# Patient Record
Sex: Female | Born: 1980 | Race: White | Hispanic: No | Marital: Married | Smoking: Never smoker
Health system: Southern US, Community
[De-identification: ages and names within clinical notes are randomized; demographics above are authoritative.]

---

## 2003-01-15 ENCOUNTER — Other Ambulatory Visit: Admission: RE | Admit: 2003-01-15 | Discharge: 2003-01-15 | Payer: Self-pay | Admitting: Gynecology

## 2003-07-12 ENCOUNTER — Ambulatory Visit (HOSPITAL_COMMUNITY): Admission: RE | Admit: 2003-07-12 | Discharge: 2003-07-12 | Payer: Self-pay

## 2003-07-12 ENCOUNTER — Encounter (INDEPENDENT_AMBULATORY_CARE_PROVIDER_SITE_OTHER): Payer: Self-pay | Admitting: Specialist

## 2003-07-12 ENCOUNTER — Ambulatory Visit (HOSPITAL_BASED_OUTPATIENT_CLINIC_OR_DEPARTMENT_OTHER): Admission: RE | Admit: 2003-07-12 | Discharge: 2003-07-12 | Payer: Self-pay

## 2004-03-26 ENCOUNTER — Other Ambulatory Visit: Admission: RE | Admit: 2004-03-26 | Discharge: 2004-03-26 | Payer: Self-pay | Admitting: Gynecology

## 2005-09-24 ENCOUNTER — Other Ambulatory Visit: Admission: RE | Admit: 2005-09-24 | Discharge: 2005-09-24 | Payer: Self-pay | Admitting: Gynecology

## 2008-09-21 ENCOUNTER — Encounter: Admission: RE | Admit: 2008-09-21 | Discharge: 2008-09-21 | Payer: Self-pay | Admitting: Orthopedic Surgery

## 2008-09-25 ENCOUNTER — Encounter: Admission: RE | Admit: 2008-09-25 | Discharge: 2008-09-25 | Payer: Self-pay | Admitting: Orthopedic Surgery

## 2009-12-10 ENCOUNTER — Encounter: Admission: RE | Admit: 2009-12-10 | Discharge: 2009-12-10 | Payer: Self-pay | Admitting: Diagnostic Neuroimaging

## 2010-06-10 ENCOUNTER — Ambulatory Visit (HOSPITAL_COMMUNITY): Admission: RE | Admit: 2010-06-10 | Discharge: 2010-06-10 | Payer: Self-pay | Admitting: Obstetrics and Gynecology

## 2011-01-15 NOTE — H&P (Signed)
NAME:  Brandy Silva, Brandy Silva                          ACCOUNT NO.:  [id number]   MEDICAL RECORD NO.:  [id number]                   PATIENT TYPE:  AMB   LOCATION:  [facility]                                 FACILITY:  [facility]   PHYSICIAN:  Ivor Costa. Farrel Gobble, M.D.              DATE OF BIRTH:  Aug 23, 1981   DATE OF ADMISSION:  07/12/2003  DATE OF DISCHARGE:                                HISTORY & PHYSICAL   CHIEF COMPLAINT:  Pelvic pain and primary dysmenorrhea.   HISTORY OF PRESENT ILLNESS:  The patient is a 30 year old gravida 0 with a  history of primary dysmenorrhea.  The patient states that her menses began  around age 17.  They were severe enough that she would often miss school and  would have nausea and vomiting.  She no longer has the nausea and vomiting,  however, but states that the pain is still there.  It has become  increasingly worse.  She has been on birth control pills since the age of 67  in order to regulate her period and help control the pain.  She states that  the pain got pretty bad last year, requiring her to treat it with Vicodin.  It got better but then has come back again this year, again requiring  Vicodin.  She is not happy taking Vicodin because of the way it makes her  feel.  She, fortunately, has no dyspareunia and no dysfunctional bleeding.   PAST OBSTETRICAL/GYNECOLOGIC HISTORY:  Otherwise unremarkable.  Her Pap  smears are normal.  Her cycles are regular.   PAST MEDICAL HISTORY:  Negative.   PAST SURGICAL HISTORY:  Negative.   SOCIAL HISTORY:  She is married.  No alcohol or tobacco.  Occasional  caffeine.  Informal exercise.   ALLERGIES:  Seasonal.   MEDICATIONS:  Allegra D and Nasonex.   FAMILY HISTORY:  Negative for endometriosis, infertility.  There is a family  history of breast cancer in a maternal grandmother.  No other GYN cancers.   PHYSICAL EXAMINATION:  GENERAL:  She is well-appearing.  CARDIAC:  Her heart is regular rate.  CHEST:  Her lungs are  clear to auscultation.  ABDOMEN:  Without rebound or guarding.  PELVIC:  She has normal external female genitalia.  The BUS is negative.  The vagina was pink and moist.  The cervix is notable for a prominent  ectropion, otherwise without gross lesions.  There was no cervical motion  tenderness.  The uterus is anteverted, mobile, and nontender.  The adnexa  are without tenderness or fullness.  There is some slight tenderness on the  left.  However, on rectovaginal exam there was tenderness bilaterally over  the uterosacral ligaments, on the right greater than the left.   GYN ultrasound:  The uterus is anteverted, normal size, shape, and contour.  The right ovary has an echo-free, thin-walled cyst inferior that measures  2.5 x 2 x  2, which had been noted on a previous ultrasound since June  without any change and without any vascular flow.  The left adnexa is  negative.   ASSESSMENT:  1. Primary dysmenorrhea.  2. Probable paratubal cyst with pelvic pain.   The patient will present for a laparoscopy, cystectomy, possible fulguration  of endometriosis.  Risks and benefits of the procedure were discussed with  the patient and accepted.  She will present on the morning of November 12.  Of note, she was given Lortab preoperatively for her postoperative pain  management.                                               Ivor Costa. Farrel Gobble, M.D.    THL/MEDQ  D:  07/11/2003  T:  07/11/2003  Job:  [id number]

## 2011-01-15 NOTE — Op Note (Signed)
NAME:  DANYLE, BOENING                          ACCOUNT NO.:  [id number]   MEDICAL RECORD NO.:  [id number]                   PATIENT TYPE:  AMB   LOCATION:  [facility]                                 FACILITY:  [facility]   PHYSICIAN:  Ivor Costa. Farrel Gobble, M.D.              DATE OF BIRTH:  [birth year]   DATE OF PROCEDURE:  07/12/2003  DATE OF DISCHARGE:                                 OPERATIVE REPORT   PREOPERATIVE DIAGNOSES:  1. Primary dysmenorrhea.  2. Paratubal cyst.   POSTOPERATIVE DIAGNOSES:  1. Primary dysmenorrhea.  2. Paratubal cyst.  3. Ovarian cyst.  4. Endometriosis.   OPERATION/PROCEDURE:  1. Laparoscopic fulguration of endometriosis with the Yag laser.  2. Left ovarian cystectomy.  3. Right paratubal marsupialization.   SURGEON:  Ivor Costa. Farrel Gobble, M.D.   ASSISTANT:  Rande Brunt. Eda Paschal, M.D.   ANESTHESIA:  General.   ESTIMATED BLOOD LOSS:  Minimal.   FINDINGS:  There were endometriotic peritoneal studding in the cul-de-sac,  mostly uterosacral ligaments bilaterally.  There was some anterior  peritoneal endometriosis as well as uterine serosal endometriosis.  There  was a left paratubal cyst that was intimate to the fimbria on the right  which were otherwise unremarkable and lush.  There was a left ovarian cyst.  The appendix, gallbladder and liver were unremarkable.   PATHOLOGY:  Left ovarian cyst wall.   COMPLICATIONS:  None.   DESCRIPTION OF PROCEDURE:  The patient was taken to the operating room.  General anesthesia was induced.  She was placed in the dorsal lithotomy  position, prepped and draped in the usual sterile fashion.  A bivalve  speculum was placed in the vagina.  The cervix was visualized and the  uterine manipulator was placed.  Gloves were then changed and the  infraumbilical incision was made through which the Veress needle was  inserted.  Free flow of fluid was obtained.  Pneumoperitoneum was then  created until tympany was appreciated above,  after which a #10/11 disposable  trocar was inserted through the umbilical port, placement in the abdomen was  confirmed.  The patient was placed in Trendelenburg position.  A #5 port was  placed in the midline.  Using the second port, we were able to better  visualize the pelvis and abdomen.  The paratubal cyst on the right was noted  as well as ovarian cyst on the left.  While we were rotating the left ovary,  a small rent in the cyst wall was made which began to bleed.  Because of  this, we later elected to remove the cyst.  However, the bleeding was not  profuse and had clotted off on its own.  The laser was advanced through the  infraumbilical port. The uterus was elevated.  The uterosacral ligaments  were treated in a sweeping fashion with a #3 ball with 10 watts of power.  The individual plaque was treated in  a series.  The ureters were identified  bilaterally.  We were noted to be free of ureters where we treated the  endometriosis that was in the peritoneum on the sidewalls as well.  The  uterus was then deflected.  The serosa on the anterior wall was treated.  The anterior peritoneum was elevated and incised.  Hydrodissection was  performed and was lifted off the bladder nicely.  At this point __________  and so we elected to go ahead and take care of the left ovarian cyst.   Two lower ports in the left and right lower quadrants were made under direct  visualization.  All ports were #5 with the exception of the infraumbilical.  Using our lower ports, we were able to elevate and rotate the ovary by  grasping the tubo-ovarian ligament.  The bleeding had noted to stop at this  point; however, the cyst was protruding through the ovarian serosa and we  elected to remove it. The cyst wall was grasped with an atraumatic grasper.  Ovary was grasped with an atraumatic grasper and was gently peeled off the  cyst which was removed from the pelvis and sent for permanent.  Inspection  of the  cyst site showed only a small area of bleeding which was treated  successfully with a Kleppinger.  The pelvis was irrigated with copious  amounts of saline and reacclimation of hemostasis was assured.   Attention was then turned towards the right adnexa as the paratubal cyst was  intimate to the tube, the fimbriated end.  We thought best, rather than  excise, to marsupialize the cyst and deflate it.  The cyst was gently  elevated.  A fine line of cautery was used for hemostasis purposes with the  Kleppinger, after which we incised the cyst wall sharply and extended it  bluntly.  The cyst drained clear fluid.  The edges of the incision were  noted to be hemostatic.   We then turned our attention anteriorly to the area that had been  hydrodissected.  Much of the endometriosis in this area seemed to have been  destroyed by crushed artifact rather than excised and, therefore, we elected  to cauterize as the area of concern was elevated with the Kleppinger and  treated appropriately.  The pelvis was then irrigated again with copious  amounts of warm saline.  We inspected it to make sure there was no other  gross endometriosis that had not been treated.  The cysts bilaterally were  inspected and noted to be hemostatic.  The instruments were then removed  under direct visualization.  The patient was noted to have bleeding at the  skin sites on all four ports, the most found infraumbilically.  We closed  the fascia with figure-of-eight of 0 Vicryl.  We treated the skin edge with  cautery gently and hemostasis was assured.  We irrigated and again  hemostasis was confirmed.  All skin ports were then closed with Dermabond.  All four ports were injected with 0.25% Marcaine and Steri-Strips were  placed.  The uterine manipulator was then removed from the vagina.  __________ was placed.  Cervix was noted to be hemostatic.  The patient was extubated in the OR, transferred to the PACU in stable  condition.  Ivor Costa. Farrel Gobble, M.D.    THL/MEDQ  D:  07/12/2003  T:  07/12/2003  Job:  [id number]

## 2013-04-28 ENCOUNTER — Ambulatory Visit: Payer: Self-pay | Admitting: Emergency Medicine

## 2013-12-24 ENCOUNTER — Ambulatory Visit: Payer: Self-pay | Admitting: Physician Assistant

## 2013-12-24 LAB — CBC WITH DIFFERENTIAL/PLATELET
Basophil #: 0.1 10*3/uL (ref 0.0–0.1)
Basophil %: 0.9 %
Eosinophil #: 0 10*3/uL (ref 0.0–0.7)
Eosinophil %: 0.5 %
HCT: 44.5 % (ref 35.0–47.0)
HGB: 15.3 g/dL (ref 12.0–16.0)
Lymphocyte #: 2.6 10*3/uL (ref 1.0–3.6)
Lymphocyte %: 30.8 %
MCH: 30.7 pg (ref 26.0–34.0)
MCHC: 34.4 g/dL (ref 32.0–36.0)
MCV: 89 fL (ref 80–100)
Monocyte #: 0.4 x10 3/mm (ref 0.2–0.9)
Monocyte %: 4.6 %
Neutrophil #: 5.3 10*3/uL (ref 1.4–6.5)
Neutrophil %: 63.2 %
Platelet: 324 10*3/uL (ref 150–440)
RBC: 4.99 10*6/uL (ref 3.80–5.20)
RDW: 12.9 % (ref 11.5–14.5)
WBC: 8.5 10*3/uL (ref 3.6–11.0)

## 2013-12-24 LAB — URINALYSIS, COMPLETE
Bilirubin,UR: NEGATIVE
Blood: NEGATIVE
Glucose,UR: NEGATIVE mg/dL (ref 0–75)
Ketone: NEGATIVE
Leukocyte Esterase: NEGATIVE
Nitrite: NEGATIVE
Ph: 7.5 (ref 4.5–8.0)
Protein: NEGATIVE
RBC,UR: NONE SEEN /HPF (ref 0–5)
Specific Gravity: 1.005 (ref 1.003–1.030)

## 2013-12-24 LAB — COMPREHENSIVE METABOLIC PANEL
Albumin: 3.8 g/dL (ref 3.4–5.0)
Alkaline Phosphatase: 82 U/L
Anion Gap: 11 (ref 7–16)
BUN: 10 mg/dL (ref 7–18)
Bilirubin,Total: 0.4 mg/dL (ref 0.2–1.0)
Calcium, Total: 9.1 mg/dL (ref 8.5–10.1)
Chloride: 103 mmol/L (ref 98–107)
Co2: 26 mmol/L (ref 21–32)
Creatinine: 0.83 mg/dL (ref 0.60–1.30)
EGFR (African American): >60
EGFR (Non-African Amer.): >60
Glucose: 100 mg/dL — ABNORMAL HIGH (ref 65–99)
Osmolality: 279 (ref 275–301)
Potassium: 3.7 mmol/L (ref 3.5–5.1)
SGOT(AST): 13 U/L — ABNORMAL LOW (ref 15–37)
SGPT (ALT): 25 U/L (ref 12–78)
Sodium: 140 mmol/L (ref 136–145)
Total Protein: 7.1 g/dL (ref 6.4–8.2)

## 2013-12-24 LAB — PREGNANCY, URINE: Pregnancy Test, Urine: NEGATIVE m[IU]/mL

## 2014-01-30 ENCOUNTER — Ambulatory Visit: Payer: Self-pay | Admitting: Otolaryngology

## 2015-10-29 ENCOUNTER — Ambulatory Visit
Admission: EM | Admit: 2015-10-29 | Discharge: 2015-10-29 | Disposition: A | Discharge status: Home / Self Care | Payer: 59 | Attending: Family Medicine | Admitting: Family Medicine

## 2015-10-29 ENCOUNTER — Encounter: Payer: Self-pay | Admitting: Emergency Medicine

## 2015-10-29 DIAGNOSIS — J0100 Acute maxillary sinusitis, unspecified: Secondary | ICD-10-CM | POA: Diagnosis not present

## 2015-10-29 MED ORDER — AMOXICILLIN-POT CLAVULANATE 875-125 MG PO TABS: 1.0000 | ORAL_TABLET | Freq: Two times a day (BID) | ORAL | Status: DC

## 2015-10-29 MED ORDER — HYDROCOD POLST-CPM POLST ER 10-8 MG/5ML PO SUER: 5.0000 mL | Freq: Every evening | ORAL | Status: AC | PRN

## 2015-10-29 NOTE — Discharge Instructions (Signed)
Take medication as prescribed. Rest. Drink plenty of fluids.  ° °Follow up with your primary care physician this week as needed. Return to Urgent care for new or worsening concerns.  ° ° °Sinusitis, Adult °Sinusitis is redness, soreness, and inflammation of the paranasal sinuses. Paranasal sinuses are air pockets within the bones of your face. They are located beneath your eyes, in the middle of your forehead, and above your eyes. In healthy paranasal sinuses, mucus is able to drain out, and air is able to circulate through them by way of your nose. However, when your paranasal sinuses are inflamed, mucus and air can become trapped. This can allow bacteria and other germs to grow and cause infection. °Sinusitis can develop quickly and last only a short time (acute) or continue over a long period (chronic). Sinusitis that lasts for more than 12 weeks is considered chronic. °CAUSES °Causes of sinusitis include: °· Allergies. °· Structural abnormalities, such as displacement of the cartilage that separates your nostrils (deviated septum), which can decrease the air flow through your nose and sinuses and affect sinus drainage. °· Functional abnormalities, such as when the small hairs (cilia) that line your sinuses and help remove mucus do not work properly or are not present. °SIGNS AND SYMPTOMS °Symptoms of acute and chronic sinusitis are the same. The primary symptoms are pain and pressure around the affected sinuses. Other symptoms include: °· Upper toothache. °· Earache. °· Headache. °· Bad breath. °· Decreased sense of smell and taste. °· A cough, which worsens when you are lying flat. °· Fatigue. °· Fever. °· Thick drainage from your nose, which often is green and may contain pus (purulent). °· Swelling and warmth over the affected sinuses. °DIAGNOSIS °Your health care provider will perform a physical exam. During your exam, your health care provider may perform any of the following to help determine if you have  acute sinusitis or chronic sinusitis: °· Look in your nose for signs of abnormal growths in your nostrils (nasal polyps). °· Tap over the affected sinus to check for signs of infection. °· View the inside of your sinuses using an imaging device that has a light attached (endoscope). °If your health care provider suspects that you have chronic sinusitis, one or more of the following tests may be recommended: °· Allergy tests. °· Nasal culture. A sample of mucus is taken from your nose, sent to a lab, and screened for bacteria. °· Nasal cytology. A sample of mucus is taken from your nose and examined by your health care provider to determine if your sinusitis is related to an allergy. °TREATMENT °Most cases of acute sinusitis are related to a viral infection and will resolve on their own within 10 days. Sometimes, medicines are prescribed to help relieve symptoms of both acute and chronic sinusitis. These may include pain medicines, decongestants, nasal steroid sprays, or saline sprays. °However, for sinusitis related to a bacterial infection, your health care provider will prescribe antibiotic medicines. These are medicines that will help kill the bacteria causing the infection. °Rarely, sinusitis is caused by a fungal infection. In these cases, your health care provider will prescribe antifungal medicine. °For some cases of chronic sinusitis, surgery is needed. Generally, these are cases in which sinusitis recurs more than 3 times per year, despite other treatments. °HOME CARE INSTRUCTIONS °· Drink plenty of water. Water helps thin the mucus so your sinuses can drain more easily. °· Use a humidifier. °· Inhale steam 3-4 times a day (for example, sit in   the bathroom with the shower running). °· Apply a warm, moist washcloth to your face 3-4 times a day, or as directed by your health care provider. °· Use saline nasal sprays to help moisten and clean your sinuses. °· Take medicines only as directed by your health care  provider. °· If you were prescribed either an antibiotic or antifungal medicine, finish it all even if you start to feel better. °SEEK IMMEDIATE MEDICAL CARE IF: °· You have increasing pain or severe headaches. °· You have nausea, vomiting, or drowsiness. °· You have swelling around your face. °· You have vision problems. °· You have a stiff neck. °· You have difficulty breathing. °  °This information is not intended to replace advice given to you by your health care provider. Make sure you discuss any questions you have with your health care provider. °  °Document Released: 08/16/2005 Document Revised: 09/06/2014 Document Reviewed: 08/31/2011 °Elsevier Interactive Patient Education ©2016 [employer] ° °

## 2015-10-29 NOTE — ED Notes (Signed)
Patient c/o sinus congestion, sinus pressure, cough, and nasal congestion for a week.  Patient denies fevers.

## 2015-10-29 NOTE — ED Provider Notes (Signed)
[facility]  ____________________________________________  Time seen: Approximately 3:52 PM  I have reviewed the triage vital signs and the nursing notes.   HISTORY  Chief Complaint Facial Pain; Nasal Congestion; and Cough   HPI KHARI LETT is a 35 y.o. female presents for complaints of 1-1.5 weeks of runny nose, nasal congestion, sinus pressure, sinus drainage. Reports over last few days developed more of a cough. States cough last night was a hackey dry  cough that would keep her up. States with sinus pressure at 4 out of 10 aching. Reports feeling getting thick greenish nasal drainage. Denies known sick contacts.  Denies headache, vision changes, dizziness, weakness, chest pain, shortness of breath, abdominal pain, extremity pain, actually swelling.  Last menstrual: Current. Denies chance of pregnancy.   History reviewed. No pertinent past medical history.  There are no active problems to display for this patient.   History reviewed. No pertinent past surgical history.  No current outpatient prescriptions on file.  Allergies Review of patient's allergies indicates no known allergies.  History reviewed. No pertinent family history.  Social History Social History  Substance Use Topics  . Smoking status: Never Smoker   . Smokeless tobacco: None  . Alcohol Use: No    Review of Systems Constitutional: No fever/chills Eyes: No visual changes. ENT: No sore throat.Positive runny nose, nasal congestion and sinus pressure. Positive intermittent cough.  Cardiovascular: Denies chest pain. Respiratory: Denies shortness of breath. Gastrointestinal: No abdominal pain.  No nausea, no vomiting.  No diarrhea.  No constipation. Genitourinary: Negative for dysuria. Musculoskeletal: Negative for back pain. Skin: Negative for rash. Neurological: Negative for headaches, focal weakness or numbness.  10-point ROS otherwise  negative.  ____________________________________________   PHYSICAL EXAM:  VITAL SIGNS: ED Triage Vitals  Enc Vitals Group     BP 10/29/15 1509 135/78 mmHg     Pulse Rate 10/29/15 1509 106 Recheck 88     Resp 10/29/15 1509 16     Temp 10/29/15 1509 98.1 F (36.7 C)     Temp Source 10/29/15 1509 Oral     SpO2 10/29/15 1509 100 %     Weight 10/29/15 1509 180 lb (81.647 kg)     Height 10/29/15 1509  (1.626 m)     Head Cir --      Peak Flow --      Pain Score 10/29/15 1511 0     Pain Loc --      Pain Edu? --      Excl. in GC? --   Constitutional: Alert and oriented. Well appearing and in no acute distress. Eyes: Conjunctivae are normal. PERRL. EOMI. Head: Atraumatic.Mild to moderate tenderness to palpation bilateral frontal and maxillary sinuses. No swelling. No erythema.   Ears: no erythema, normal TMs bilaterally.   Nose: nasal congestion with bilateral nasal turbinate erythema and edema.   Mouth/Throat: Mucous membranes are moist.  Oropharynx non-erythematous.No tonsillar swelling or exudate.  Neck: No stridor.  No cervical spine tenderness to palpation. Hematological/Lymphatic/Immunilogical: No cervical lymphadenopathy. Cardiovascular: Normal rate, regular rhythm. Grossly normal heart sounds.  Good peripheral circulation. Respiratory: Normal respiratory effort.  No retractions. Lungs CTAB. No wheezes, rales or rhonchi. Good air movement.  Gastrointestinal: Soft and nontender. No distention. Normal Bowel sounds. No CVA tenderness. Musculoskeletal: No lower or upper extremity tenderness nor edema.  Bilateral pedal pulses equal and easily palpated. No cervical, thoracic or lumbar tenderness to palpation.  Neurologic:  Normal speech and language. No gross focal neurologic deficits are  appreciated. No gait instability. Skin:  Skin is warm, dry and intact. No rash noted. Psychiatric: Mood and affect are normal. Speech and behavior are  normal.  ____________________________________________   LABS (all labs ordered are listed, but only abnormal results are displayed)  Labs Reviewed - No data to display ____________________________________________   INITIAL IMPRESSION / ASSESSMENT AND PLAN / ED COURSE  Pertinent labs & imaging results that were available during my care of the patient were reviewed by me and considered in my medical decision making (see chart for details).  Well-appearing patient. No acute distress. Presents for complaints of 1-1.5 weeks of runny nose, nasal congestion, sinus pressure and sinus drainage. Patient reports that she initially thought that it was her allergies. Patient states that symptoms continued. Unresolved with over-the-counter antihistamines and cough congestion medicines. Suspect maxillary sinusitis with postnasal drainage. Will treat with oral Augmentin, over-the-counter Claritin-D, tussionex prn at bedtime. Encourage rest, fluids, over-the-counter Tylenol or ibuprofen as needed. Encourage PCP follow up as needed.   Discussed follow up with Primary care physician this week. Discussed follow up and return parameters including no resolution or any worsening concerns. Patient verbalized understanding and agreed to plan.   ____________________________________________   FINAL CLINICAL IMPRESSION(S) / ED DIAGNOSES  Final diagnoses:  Acute maxillary sinusitis, recurrence not specified      Note: This dictation was prepared with Dragon dictation along with smaller phrase technology. Any transcriptional errors that result from this process are unintentional.    Renford Dills, NP 10/29/15 1609

## 2017-02-18 ENCOUNTER — Ambulatory Visit (INDEPENDENT_AMBULATORY_CARE_PROVIDER_SITE_OTHER): Payer: Worker's Compensation

## 2017-02-18 ENCOUNTER — Ambulatory Visit
Admission: EM | Admit: 2017-02-18 | Discharge: 2017-02-18 | Disposition: A | Discharge status: Home / Self Care | Payer: Worker's Compensation | Attending: Family Medicine | Admitting: Family Medicine

## 2017-02-18 DIAGNOSIS — S9032XA Contusion of left foot, initial encounter: Secondary | ICD-10-CM

## 2017-02-18 DIAGNOSIS — M79672 Pain in left foot: Secondary | ICD-10-CM

## 2017-02-18 MED ORDER — MELOXICAM 15 MG PO TABS: 15.0000 mg | ORAL_TABLET | Freq: Every day | ORAL | 0 refills | Status: AC

## 2017-02-18 NOTE — ED Triage Notes (Signed)
Patient complains of left foot pain that occurred after a table fell over on her foot right before 9am this morning. Patient states that table was pretty heavy. Patient states that she is having pain across the top of her foot close to toes.

## 2017-02-18 NOTE — Discharge Instructions (Signed)
Will recommend placing ice on the left foot please follow-up with Mrs[facility] Christell [facility]

## 2017-02-18 NOTE — ED Provider Notes (Signed)
[facility]    CSN: [id number] Arrival date & time: 02/18/17  1302     History   Chief Complaint Chief Complaint  Patient presents with  . Foot Pain    HPI Brandy Silva is a 36 y.o. female.   Patient is here because of contusion to the dorsum of her left foot. She states she was at work was a heavy object fell and hit her on the dorsum of the left foot near the distal tip of her metatarsal bones. Stasis day went on she's had increased pain increased difficulty using the foot. This happened about 4 hours ago. No broken skin was present. 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 No known drug allergies. She does not smoke. No previous surgeries. No pertinent family medical history relevant to today's visit.   The history is provided by the patient. No language interpreter was used.  Foot Injury  Location:  Foot Injury: no   Foot location:  L foot and dorsum of L foot Pain details:    Quality:  Throbbing   Radiates to:  Does not radiate   Severity:  Moderate   Onset quality:  Sudden   Duration:  6 hours   Timing:  Constant   Progression:  Worsening Chronicity:  New Dislocation: no   Prior injury to area:  No Relieved by:  Nothing Worsened by:  Bearing weight and activity Ineffective treatments:  None tried   History reviewed. No pertinent past medical history.  There are no active problems to display for this patient.   History reviewed. No pertinent surgical history.  OB History    No data available       Home Medications    Prior to Admission medications   Medication Sig Start Date End Date Taking? Authorizing Provider  loratadine (CLARITIN) 10 MG tablet Take 10 mg by mouth daily.   Yes [provider]  amoxicillin-clavulanate (AUGMENTIN) 875-125 MG tablet Take 1 tablet by mouth every 12 (twelve) hours. 10/29/15   Renford DillsMiller, Lindsey, NP  meloxicam (MOBIC) 15 MG tablet Take 1 tablet (15 mg total) by  mouth daily. 02/18/17   Hassan RowanWade, Kinzly Pierrelouis, MD    Family History History reviewed. No pertinent family history.  Social History Social History  Substance Use Topics  . Smoking status: Never Smoker  . Smokeless tobacco: Never Used  . Alcohol use No     Allergies   Patient has no known allergies.   Review of Systems Review of Systems  Musculoskeletal: Positive for gait problem, joint swelling and myalgias.  All other systems reviewed and are negative.    Physical Exam Triage Vital Signs ED Triage Vitals  Enc Vitals Group     BP 02/18/17 1408 125/73     Pulse Rate 02/18/17 1408 97     Resp 02/18/17 1408 16     Temp 02/18/17 1408 98.2 F (36.8 C)     Temp Source 02/18/17 1408 Oral     SpO2 02/18/17 1408 100 %     Weight 02/18/17 1406 170 lb (77.1 kg)     Height 02/18/17 1406 5\' 4"  (1.626 m)     Head Circumference --      Peak Flow --      Pain Score 02/18/17 1406 6     Pain Loc --      Pain Edu? --      Excl. in GC? --    No data found.  Updated Vital Signs BP 125/73 (BP Location: Left Arm)   Pulse 97   Temp 98.2 F (36.8 C) (Oral)   Resp 16   Ht 5\' 4"  (1.626 m)   Wt 170 lb (77.1 kg)   LMP 02/07/2017   SpO2 100%   BMI 29.18 kg/m   Visual Acuity Right Eye Distance:   Left Eye Distance:   Bilateral Distance:    Right Eye Near:   Left Eye Near:    Bilateral Near:     Physical Exam  Constitutional: She is oriented to person, place, and time. She appears well-developed and well-nourished. No distress.  HENT:  Head: Normocephalic and atraumatic.  Eyes: Pupils are equal, round, and reactive to light.  Neck: Normal range of motion. Neck supple.  Pulmonary/Chest: Effort normal.  Musculoskeletal: Normal range of motion. She exhibits edema and tenderness.       Left foot: There is tenderness, bony tenderness and swelling. There is no deformity and no laceration.       Feet:  Tenderness over the distal part of the left foot over the metatarsal bones    Neurological: She is alert and oriented to person, place, and time. No cranial nerve deficit.  Skin: Skin is warm.  Psychiatric: She has a normal mood and affect.     UC Treatments / Results  Labs (all labs ordered are listed, but only abnormal results are displayed) Labs Reviewed - No data to display  EKG  EKG Interpretation None       Radiology Dg Foot Complete Left  Result Date: 02/18/2017 CLINICAL DATA:  PT with left anterior/distal foot pain after dropping a table on her left foot today. No hx of previous injury or trauma. EXAM: LEFT FOOT - COMPLETE 3+ VIEW COMPARISON:  None. FINDINGS: There is no evidence of fracture or dislocation. 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 Soft tissues are unremarkable. IMPRESSION: No acute osseous injury of the left foot. Fibro-osseous calcaneonavicular coalition. Electronically Signed   By: Elige Ko   On: 02/18/2017 14:43    Procedures Procedures (including critical care time)  Medications Ordered in UC Medications - No data to display   Initial Impression / Assessment and Plan / UC Course  I have reviewed the triage vital signs and the nursing notes.  Pertinent labs & imaging results that were available during my care of the patient were reviewed by me and considered in my medical decision making (see chart for details).   patient shown results of her x-rays which were negative for acute fracture. Will have her follow up with Ms. Tommie Maximiano Coss next week. Will limit her standing and walking to 2 hours a day at work and provide also should refer her for support of the foot recommend strongly she iced the foot    Final Clinical Impressions(s) / UC Diagnoses   Final diagnoses:  Foot pain, left  Contusion of left foot, initial encounter    New Prescriptions Discharge  Medication List as of 02/18/2017  3:09 PM    START taking these medications   Details  meloxicam (MOBIC) 15 MG tablet Take 1 tablet (15 mg total) by mouth daily., Starting Fri 02/18/2017, Normal         Hassan Rowan, MD 02/18/17 [phone]

## 2020-10-21 ENCOUNTER — Ambulatory Visit
Admission: EM | Admit: 2020-10-21 | Discharge: 2020-10-21 | Disposition: A | Discharge status: Home / Self Care | Payer: Managed Care, Other (non HMO)

## 2020-10-21 ENCOUNTER — Ambulatory Visit (INDEPENDENT_AMBULATORY_CARE_PROVIDER_SITE_OTHER): Payer: Managed Care, Other (non HMO)

## 2020-10-21 DIAGNOSIS — U071 COVID-19: Secondary | ICD-10-CM

## 2020-10-21 DIAGNOSIS — J1282 Pneumonia due to coronavirus disease 2019: Secondary | ICD-10-CM | POA: Diagnosis not present

## 2020-10-21 NOTE — ED Provider Notes (Signed)
Brandy Silva    CSN: [id number] Arrival date & time: 10/21/20  1124      History   Chief Complaint Chief Complaint  Patient presents with  . Shortness of Breath    HPI Brandy Silva is a 40 y.o. female.   Pt is a 40 year old female that presents with SOB for approx 10 days, worsening over the past few days, low-grade fever with Tmax 99.8 yesterday. Tested positive for COVID via [facility] (1 hour test) on 02/18 and URI symptoms started 02/12. Also c/o central chest discomfort intermittently. Last dose of ibuprofen at approx 0900. Took albuterol this morning w/only mild improvement to SOB.   Shortness of Breath   History reviewed. No pertinent past medical history.  There are no problems to display for this patient.   History reviewed. No pertinent surgical history.  OB History   No obstetric history on file.      Home Medications    Prior to Admission medications   Medication Sig Start Date End Date Taking? Authorizing Provider  albuterol (VENTOLIN HFA) 108 (90 Base) MCG/ACT inhaler SMARTSIG:2 Inhalation By Mouth Every 4-6 Hours PRN 10/16/20  Yes [provider]  atorvastatin (LIPITOR) 20 MG tablet Take 1 tablet by mouth daily. 07/15/20 07/15/21 Yes [provider]  loratadine (CLARITIN) 10 MG tablet Take 10 mg by mouth daily.   Yes [provider]  rOPINIRole (REQUIP) 1 MG tablet Take by mouth. 02/19/20 02/18/21 Yes [provider]  meloxicam (MOBIC) 15 MG tablet Take 1 tablet (15 mg total) by mouth daily. 02/18/17   Hassan Rowan, MD    Family History History reviewed. No pertinent family history.  Social History Social History   Tobacco Use  . Smoking status: Never Smoker  . Smokeless tobacco: Never Used  Vaping Use  . Vaping Use: Never used  Substance Use Topics  . Alcohol use: No  . Drug use: No     Allergies   Patient has no known allergies.   Review of Systems Review of Systems  Respiratory: Positive for  shortness of breath.      Physical Exam Triage Vital Signs ED Triage Vitals  Enc Vitals Group     BP 10/21/20 1139 (!) 142/90     Pulse Rate 10/21/20 1139 (!) 104     Resp 10/21/20 1139 20     Temp 10/21/20 1139 98.5 F (36.9 C)     Temp Source 10/21/20 1139 Oral     SpO2 10/21/20 1139 98 %     Weight 10/21/20 1142 200 lb (90.7 kg)     Height 10/21/20 1142 5\' 4"  (1.626 m)     Head Circumference --      Peak Flow --      Pain Score 10/21/20 1142 0     Pain Loc --      Pain Edu? --      Excl. in GC? --    No data found.  Updated Vital Signs BP (!) 142/90 (BP Location: Left Arm)   Pulse (!) 104   Temp 98.5 F (36.9 C) (Oral)   Resp 20   Ht 5\' 4"  (1.626 m)   Wt 200 lb (90.7 kg)   LMP 10/10/2020 (Approximate)   SpO2 98%   BMI 34.33 kg/m   Visual Acuity Right Eye Distance:   Left Eye Distance:   Bilateral Distance:    Right Eye Near:   Left Eye Near:    Bilateral Near:  Physical Exam Vitals and nursing note reviewed.  Constitutional:      General: She is not in acute distress.    Appearance: Normal appearance. She is not ill-appearing, toxic-appearing or diaphoretic.  HENT:     Head: Normocephalic.  Eyes:     Conjunctiva/sclera: Conjunctivae normal.  Cardiovascular:     Rate and Rhythm: Normal rate and regular rhythm.     Pulses: Normal pulses.     Heart sounds: Normal heart sounds.  Pulmonary:     Effort: Pulmonary effort is normal.     Breath sounds: Normal breath sounds.  Musculoskeletal:        General: Normal range of motion.     Cervical back: Normal range of motion.  Skin:    General: Skin is warm and dry.     Findings: No rash.  Neurological:     Mental Status: She is alert.  Psychiatric:        Mood and Affect: Mood normal.      UC Treatments / Results  Labs (all labs ordered are listed, but only abnormal results are displayed) Labs Reviewed - No data to display  EKG   Radiology DG Chest 2 View  Result Date:  10/21/2020 CLINICAL DATA:  Shortness of breath.  COVID. EXAM: CHEST - 2 VIEW COMPARISON:  09/25/2008. FINDINGS: Mediastinum hilar structures normal. Heart size normal. Low lung volumes. Bilateral interstitial prominence. Pneumonitis could present this fashion. No pleural effusion or pneumothorax. Mild thoracic spine scoliosis. Degenerative change thoracic spine. IMPRESSION: Low lung volumes. Bilateral interstitial prominence consistent with pneumonitis. Electronically Signed   By: Maisie Fus  Register   On: 10/21/2020 12:25    Procedures Procedures (including critical care time)  Medications Ordered in UC Medications - No data to display  Initial Impression / Assessment and Plan / UC Course  I have reviewed the triage vital signs and the nursing notes.  Pertinent labs & imaging results that were available during my care of the patient were reviewed by me and considered in my medical decision making (see chart for details).     Covid pneumonia X ray consistent with this Supportive measures No concern for hypoxia today.  Strict ER and return precautions given.   Final Clinical Impressions(s) / UC Diagnoses   Final diagnoses:  Pneumonia due to COVID-19 virus     Discharge Instructions     Your x ray shows COVID pneumonia and inflammation in the lungs from the virus. Otherwise your oxygen is good here today.  You can take OTC medicines as needed Recommend vitamins to include Vitamin C 500 twice a day Zinc 50 mg daily Vitamin 5000 daily.   Elderberry Rest, hydrate Continue to monitor symptoms. For any worsening symptoms you will need to go to the ER.     ED Prescriptions    None     PDMP not reviewed this encounter.   Janace Aris, NP 10/21/20 1319

## 2020-10-21 NOTE — Discharge Instructions (Addendum)
Your x ray shows COVID pneumonia and inflammation in the lungs from the virus. Otherwise your oxygen is good here today.  You can take OTC medicines as needed Recommend vitamins to include Vitamin C 500 twice a day Zinc 50 mg daily Vitamin 5000 daily.   Elderberry Rest, hydrate Continue to monitor symptoms. For any worsening symptoms you will need to go to the ER.

## 2020-10-21 NOTE — ED Triage Notes (Signed)
Pt c/o SOB for approx 10 days, worsening over the past few days, low-grade fever with Tmax 99.8 yesterday. Tested positive for COVID via [facility] (1 hour test) on 02/18 and URI symptoms started 02/12. Also c/o central chest discomfort intermittently.  Last dose of ibuprofen at approx 0900.   Took albuterol this morning w/only mild improvement to SOB. No current n/v/d.  Lung sounds slightly diminished right lower lobe, otherwise CTA

## 2022-08-23 IMAGING — DX DG CHEST 2V
2 series · 2 of 2 positions shown · non-contrast
Comparison: 09/25/2008.

CLINICAL DATA: Shortness of breath.  COVID.

EXAM:
CHEST - 2 VIEW

[chest pa]
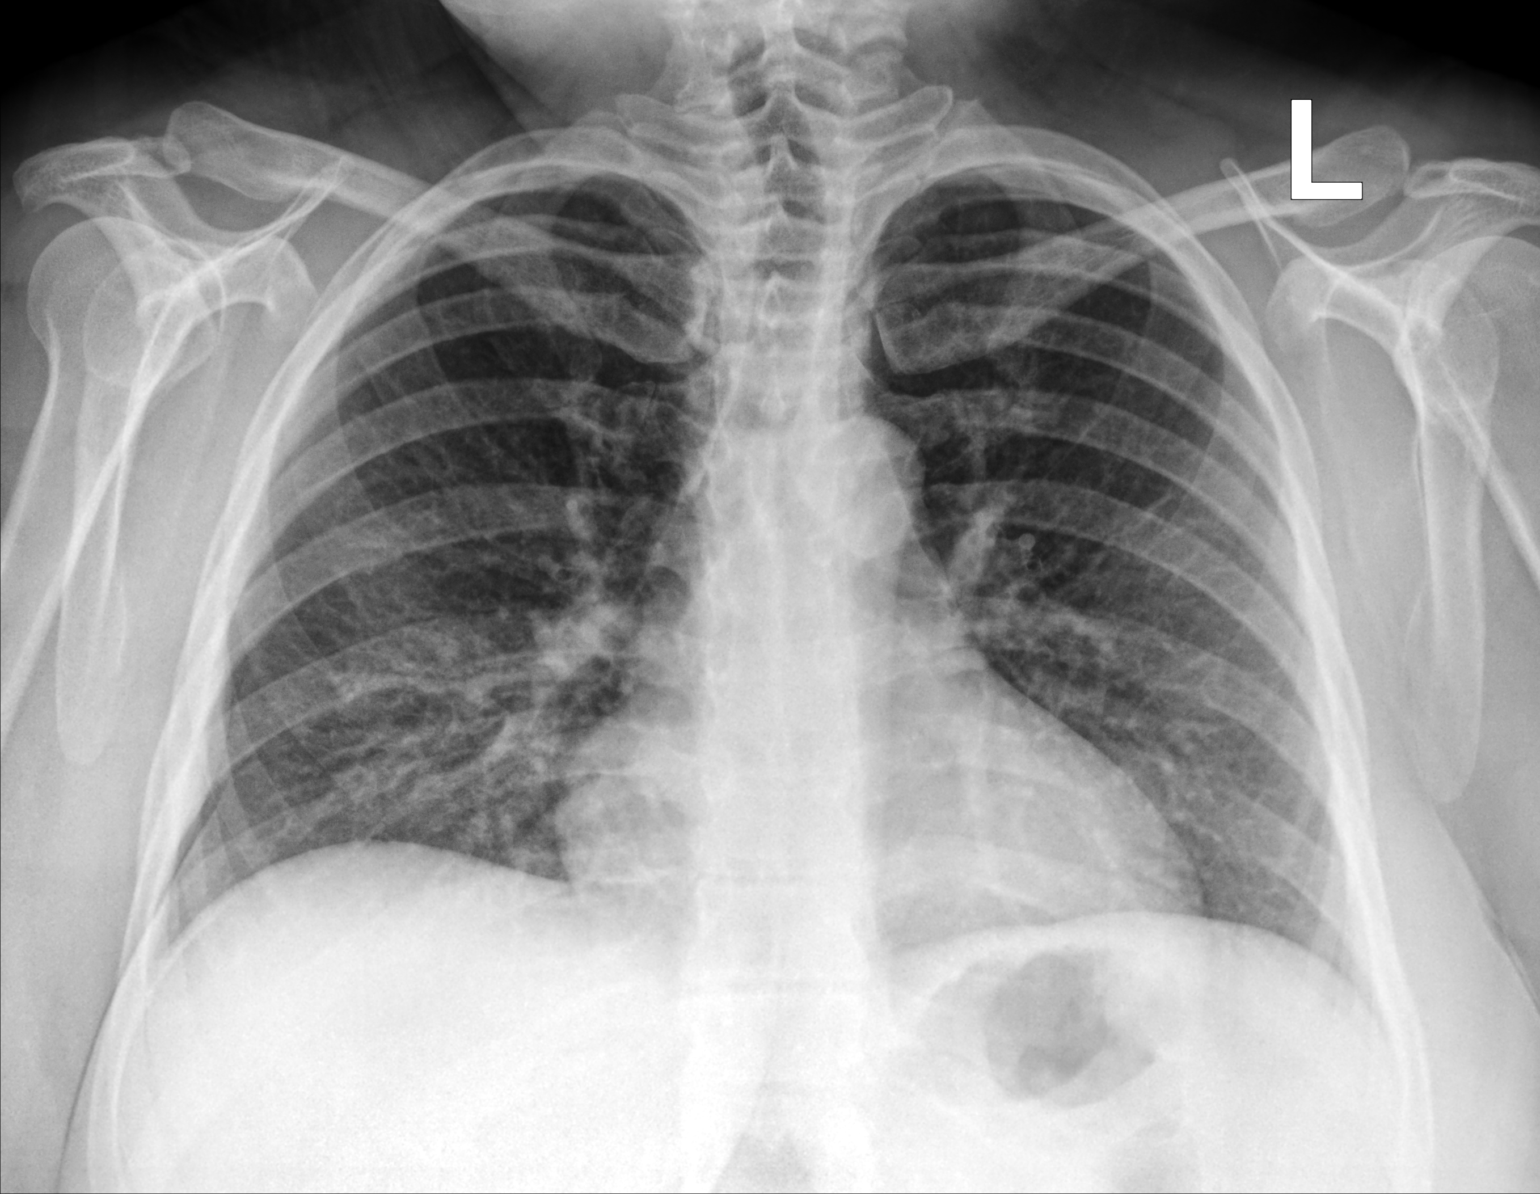

[chest lat]
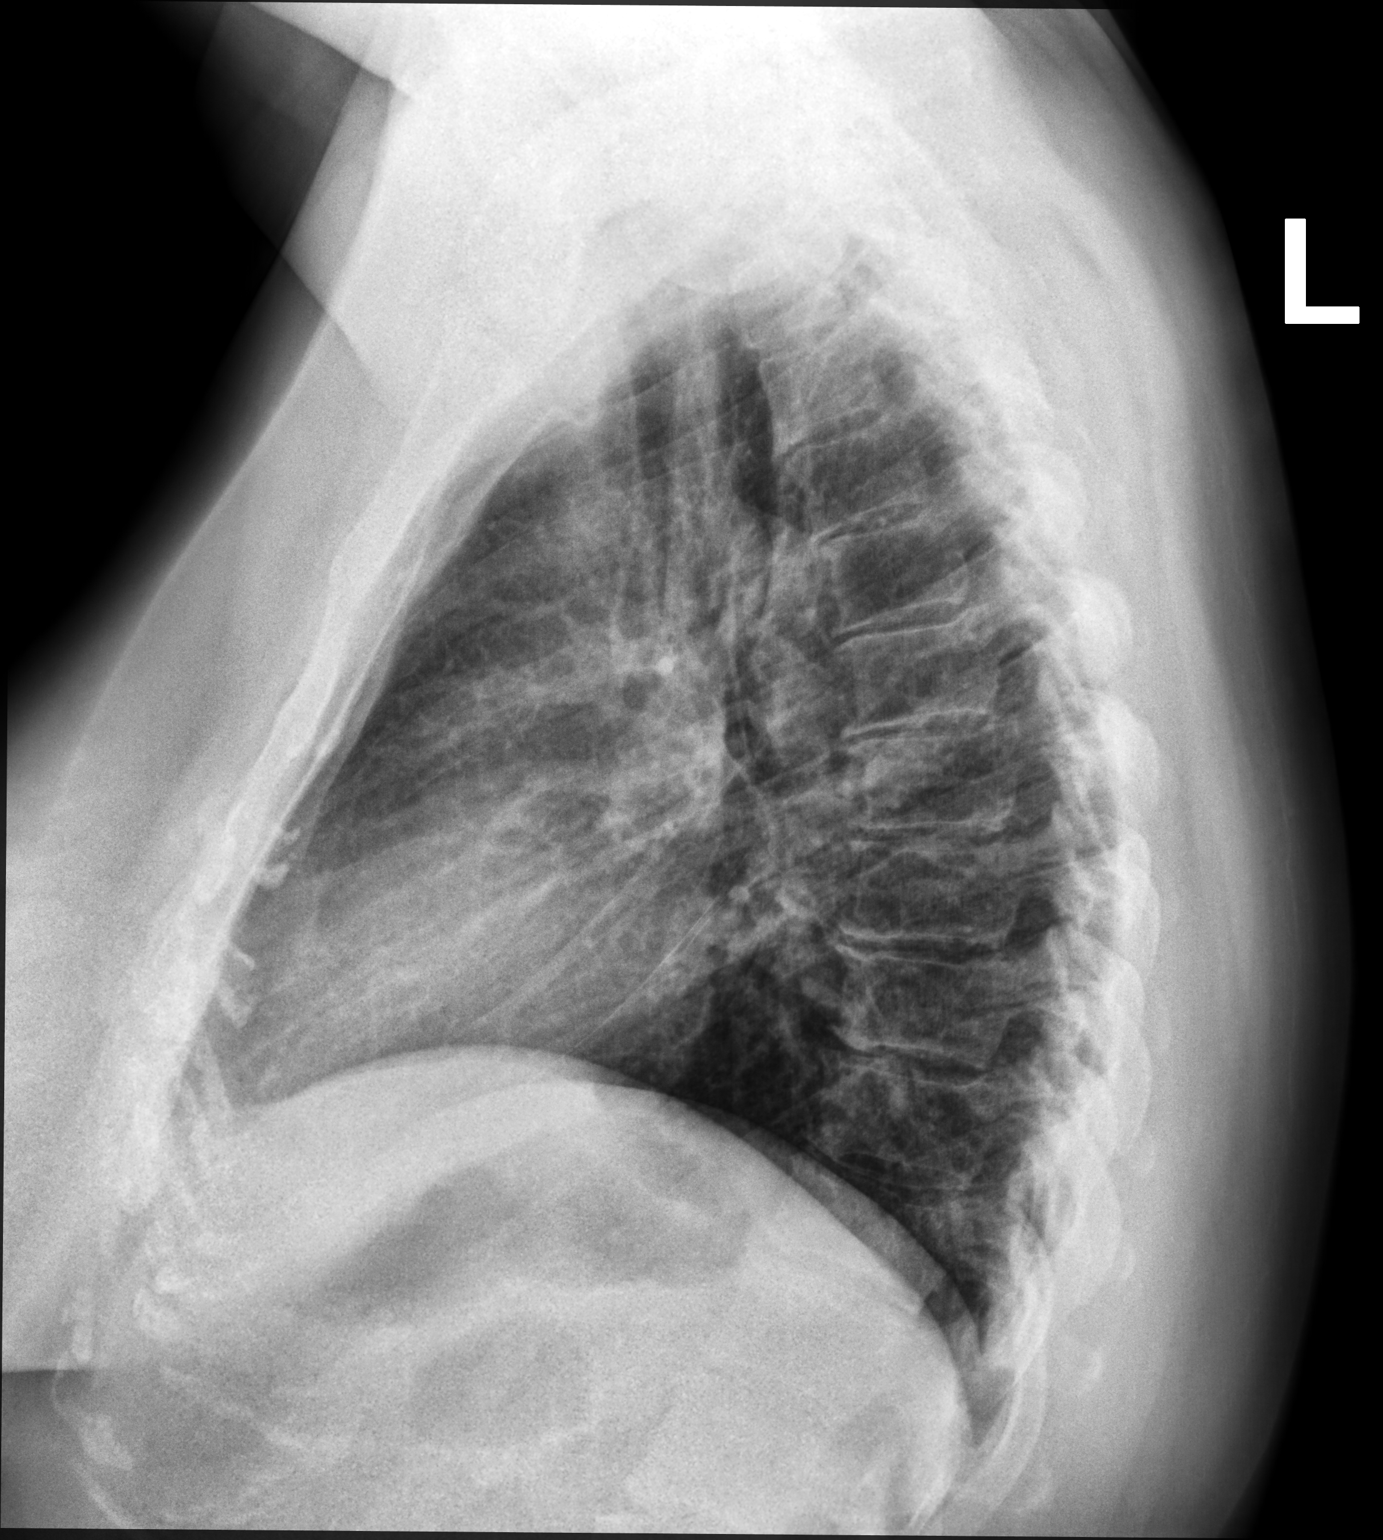

[2 of 2 positions shown; findings below may reference images not displayed]

FINDINGS: Mediastinum hilar structures normal. Heart size normal. Low lung
volumes. Bilateral interstitial prominence. Pneumonitis could
present this fashion. No pleural effusion or pneumothorax. Mild
thoracic spine scoliosis. Degenerative change thoracic spine.
IMPRESSION: Low lung volumes. Bilateral interstitial prominence consistent with
pneumonitis.
